# Patient Record
Sex: Female | Born: 1955 | Race: White | Hispanic: No | Marital: Married | Smoking: Former smoker
Health system: Southern US, Community
[De-identification: ages and names within clinical notes are randomized; demographics above are authoritative.]

## PROBLEM LIST (undated history)

## (undated) DIAGNOSIS — F329 Major depressive disorder, single episode, unspecified: Secondary | ICD-10-CM

## (undated) DIAGNOSIS — C50919 Malignant neoplasm of unspecified site of unspecified female breast: Secondary | ICD-10-CM

## (undated) DIAGNOSIS — M549 Dorsalgia, unspecified: Secondary | ICD-10-CM

## (undated) DIAGNOSIS — M25569 Pain in unspecified knee: Secondary | ICD-10-CM

## (undated) DIAGNOSIS — G8929 Other chronic pain: Secondary | ICD-10-CM

## (undated) DIAGNOSIS — M1990 Unspecified osteoarthritis, unspecified site: Secondary | ICD-10-CM

## (undated) DIAGNOSIS — F32A Depression, unspecified: Secondary | ICD-10-CM

## (undated) HISTORY — PX: WISDOM TOOTH EXTRACTION: SHX21

## (undated) HISTORY — PX: LUMBAR EPIDURAL INJECTION: SHX1980

## (undated) HISTORY — PX: BREAST LUMPECTOMY: SHX2

---

## 2002-09-11 ENCOUNTER — Encounter: Admission: RE | Admit: 2002-09-11 | Discharge: 2002-12-10 | Payer: Self-pay | Admitting: Family Medicine

## 2011-10-30 ENCOUNTER — Emergency Department (HOSPITAL_BASED_OUTPATIENT_CLINIC_OR_DEPARTMENT_OTHER)
Admission: EM | Admit: 2011-10-30 | Discharge: 2011-10-30 | Disposition: A | Discharge status: Home Health | Payer: BC Managed Care – PPO | Attending: Emergency Medicine | Admitting: Emergency Medicine

## 2011-10-30 ENCOUNTER — Encounter (HOSPITAL_BASED_OUTPATIENT_CLINIC_OR_DEPARTMENT_OTHER): Payer: Self-pay | Admitting: Emergency Medicine

## 2011-10-30 ENCOUNTER — Emergency Department (INDEPENDENT_AMBULATORY_CARE_PROVIDER_SITE_OTHER): Payer: BC Managed Care – PPO

## 2011-10-30 DIAGNOSIS — M1710 Unilateral primary osteoarthritis, unspecified knee: Secondary | ICD-10-CM

## 2011-10-30 DIAGNOSIS — F329 Major depressive disorder, single episode, unspecified: Secondary | ICD-10-CM | POA: Insufficient documentation

## 2011-10-30 DIAGNOSIS — M25569 Pain in unspecified knee: Secondary | ICD-10-CM

## 2011-10-30 DIAGNOSIS — F3289 Other specified depressive episodes: Secondary | ICD-10-CM | POA: Insufficient documentation

## 2011-10-30 DIAGNOSIS — Z87891 Personal history of nicotine dependence: Secondary | ICD-10-CM | POA: Insufficient documentation

## 2011-10-30 DIAGNOSIS — M129 Arthropathy, unspecified: Secondary | ICD-10-CM | POA: Insufficient documentation

## 2011-10-30 HISTORY — DX: Other chronic pain: G89.29

## 2011-10-30 HISTORY — DX: Depression, unspecified: F32.A

## 2011-10-30 HISTORY — DX: Major depressive disorder, single episode, unspecified: F32.9

## 2011-10-30 HISTORY — DX: Pain in unspecified knee: M25.569

## 2011-10-30 HISTORY — DX: Unspecified osteoarthritis, unspecified site: M19.90

## 2011-10-30 HISTORY — DX: Dorsalgia, unspecified: M54.9

## 2011-10-30 MED ORDER — HYDROCODONE-ACETAMINOPHEN 5-325 MG PO TABS: 1.0000 | ORAL_TABLET | ORAL | Status: AC | PRN

## 2011-10-30 NOTE — Discharge Instructions (Signed)
Degenerative Arthritis  You have osteoarthritis. This is the wear and tear arthritis that comes with aging. It is also called degenerative arthritis. This is common in people past middle age. It is caused by stress on the joints. The large weight bearing joints of the lower extremities are most often affected. The knees, hips, back, neck, and hands can become painful, swollen, and stiff. This is the most common type of arthritis. It comes on with age, carrying too much weight, or from an injury.  Treatment includes resting the sore joint until the pain and swelling improve. Crutches or a walker may be needed for severe flares. Only take over-the-counter or prescription medicines for pain, discomfort, or fever as directed by your caregiver. Local heat therapy may improve motion. Cortisone shots into the joint are sometimes used to reduce pain and swelling during flares.  Osteoarthritis is usually not crippling and progresses slowly. There are things you can do to decrease pain:  · Avoid high impact activities.  · Exercise regularly.  · Low impact exercises such as walking, biking and swimming help to keep the muscles strong and keep normal joint function.  · Stretching helps to keep your range of motion.  · Lose weight if you are overweight. This reduces joint stress.  In severe cases when you have pain at rest or increasing disability, joint surgery may be helpful. See your caregiver for follow-up treatment as recommended.   SEEK IMMEDIATE MEDICAL CARE IF:   · You have severe joint pain.  · Marked swelling and redness in your joint develops.  · You develop a high fever.  Document Released: 08/08/2005 Document Revised: 07/28/2011 Document Reviewed: 01/08/2007  ExitCare® Patient Information ©2012 [employer].

## 2011-10-30 NOTE — ED Notes (Signed)
Left knee pain worsening since last night.  Pt states she hears a clicking sound.  No injury or fever.  No warmth to area noted.  Good CMS.

## 2011-10-30 NOTE — ED Provider Notes (Signed)
History     CSN: [id number]  Arrival date & time 10/30/11  1242   First MD Initiated Contact with Patient 10/30/11 1422      Chief Complaint  Patient presents with  . Knee Pain    (Consider location/radiation/quality/duration/timing/severity/associated sxs/prior treatment) Patient is a 56 y.o. female presenting with knee pain. The history is provided by the patient.  Knee Pain This is a chronic problem. The problem occurs constantly. The problem has been gradually worsening. Pertinent negatives include no chest pain and no shortness of breath. The symptoms are aggravated by bending and walking. The symptoms are relieved by nothing.   Pt with chronic arthritis in L knee and p/w worsening of ongoing pain now with clicking sound when bending knee. No fever, chill, N/V, calf swelling or pain. No redness or warmth Past Medical History  Diagnosis Date  . Arthritis   . Depression   . Chronic back pain   . Chronic knee pain     Past Surgical History  Procedure Date  . Wisdom tooth extraction   . Lumbar epidural injection     History reviewed. No pertinent family history.  History  Substance Use Topics  . Smoking status: Former Games developer  . Smokeless tobacco: Not on file  . Alcohol Use: No    OB History    Grav Para Term Preterm Abortions TAB SAB Ect Mult Living                  Review of Systems  Constitutional: Negative for fever and chills.  Respiratory: Negative for shortness of breath.   Cardiovascular: Negative for chest pain, palpitations and leg swelling.  Musculoskeletal: Positive for arthralgias. Negative for myalgias and back pain.  Skin: Negative for color change, pallor, rash and wound.  Neurological: Negative for weakness and numbness.    Allergies  Ciprofloxacin  Home Medications   Current Outpatient Rx  Name Route Sig Dispense Refill  . ASPIRIN 81 MG PO CHEW Oral Chew 81 mg by mouth daily.    . BUPROPION HCL ER (XL) 300 MG PO TB24 Oral Take 300 mg  by mouth daily.    . CELECOXIB 200 MG PO CAPS Oral Take 200 mg by mouth daily.    Marland Kitchen CETIRIZINE-PSEUDOEPHEDRINE ER 5-120 MG PO TB12 Oral Take 1 tablet by mouth daily as needed.    . DULOXETINE HCL 60 MG PO CPEP Oral Take 60 mg by mouth daily.    Marland Kitchen ESTROGENS CONJUGATED 0.3 MG PO TABS Oral Take 0.3 mg by mouth daily. Take daily for 21 days then do not take for 7 days.    Marland Kitchen SIMVASTATIN 10 MG PO TABS Oral Take 10 mg by mouth at bedtime.    Marland Kitchen HYDROCODONE-ACETAMINOPHEN 5-325 MG PO TABS Oral Take 1 tablet by mouth every 4 (four) hours as needed for pain. 10 tablet 0    BP 111/67  Pulse 83  Temp(Src) 97.5 F (36.4 C) (Oral)  Resp 16  SpO2 97%  Physical Exam  Constitutional: She is oriented to person, place, and time. She appears well-developed and well-nourished.       obese  HENT:  Head: Normocephalic and atraumatic.  Neck: Normal range of motion. Neck supple.  Pulmonary/Chest: Effort normal.  Abdominal: Soft.  Musculoskeletal:       Crepitance with ROM of L knee. No laxity, redness, swelling, injury, warmth. 2+ radial pulses. Sensation and motor intact  Neurological: She is alert and oriented to person, place, and time.  Skin: Skin  is warm and dry. No rash noted. No erythema. No pallor.    ED Course  Procedures (including critical care time)  Labs Reviewed - No data to display Dg Knee Complete 4 Views Left  10/30/2011  *RADIOLOGY REPORT*  Clinical Data: 56 year old female with left knee pain.  LEFT KNEE - COMPLETE 4+ VIEW  Comparison: None  Findings: Joint space narrowing and osteophytosis and all three compartments noted. There is no evidence of fracture, subluxation or dislocation. No focal bony lesions are present. There is no evidence of joint effusion.  IMPRESSION: No evidence of acute abnormality.  Mild - moderate tricompartmental degenerative changes.  Original Report Authenticated By: Rosendo Gros, M.D.     1. Degenerative arthritis of knee       MDM    Counseled of  need to lose weight and F/U with ortho.   Loren Racer, MD 10/30/11 1447

## 2011-11-02 ENCOUNTER — Other Ambulatory Visit: Payer: Self-pay | Admitting: Internal Medicine

## 2014-01-22 ENCOUNTER — Emergency Department (HOSPITAL_BASED_OUTPATIENT_CLINIC_OR_DEPARTMENT_OTHER)
Admission: EM | Admit: 2014-01-22 | Discharge: 2014-01-22 | Disposition: A | Discharge status: Home / Self Care | Payer: BC Managed Care – PPO | Attending: Emergency Medicine | Admitting: Emergency Medicine

## 2014-01-22 ENCOUNTER — Encounter (HOSPITAL_BASED_OUTPATIENT_CLINIC_OR_DEPARTMENT_OTHER): Payer: Self-pay | Admitting: Emergency Medicine

## 2014-01-22 ENCOUNTER — Emergency Department (HOSPITAL_BASED_OUTPATIENT_CLINIC_OR_DEPARTMENT_OTHER): Payer: BC Managed Care – PPO

## 2014-01-22 DIAGNOSIS — F3289 Other specified depressive episodes: Secondary | ICD-10-CM | POA: Insufficient documentation

## 2014-01-22 DIAGNOSIS — M545 Low back pain, unspecified: Secondary | ICD-10-CM | POA: Insufficient documentation

## 2014-01-22 DIAGNOSIS — Z853 Personal history of malignant neoplasm of breast: Secondary | ICD-10-CM | POA: Insufficient documentation

## 2014-01-22 DIAGNOSIS — Z7982 Long term (current) use of aspirin: Secondary | ICD-10-CM | POA: Insufficient documentation

## 2014-01-22 DIAGNOSIS — M549 Dorsalgia, unspecified: Secondary | ICD-10-CM

## 2014-01-22 DIAGNOSIS — Z79899 Other long term (current) drug therapy: Secondary | ICD-10-CM | POA: Insufficient documentation

## 2014-01-22 DIAGNOSIS — Z791 Long term (current) use of non-steroidal anti-inflammatories (NSAID): Secondary | ICD-10-CM | POA: Insufficient documentation

## 2014-01-22 DIAGNOSIS — M129 Arthropathy, unspecified: Secondary | ICD-10-CM | POA: Insufficient documentation

## 2014-01-22 DIAGNOSIS — Z9889 Other specified postprocedural states: Secondary | ICD-10-CM | POA: Insufficient documentation

## 2014-01-22 DIAGNOSIS — G8929 Other chronic pain: Secondary | ICD-10-CM | POA: Insufficient documentation

## 2014-01-22 DIAGNOSIS — F329 Major depressive disorder, single episode, unspecified: Secondary | ICD-10-CM | POA: Insufficient documentation

## 2014-01-22 HISTORY — DX: Malignant neoplasm of unspecified site of unspecified female breast: C50.919

## 2014-01-22 LAB — SEDIMENTATION RATE: SED RATE: 13 mm/h (ref 0–22)

## 2014-01-22 MED ORDER — HYDROMORPHONE HCL PF 2 MG/ML IJ SOLN
2.0000 mg | Freq: Once | INTRAMUSCULAR | Status: AC
  Administered 2014-01-22: 2 mg via INTRAMUSCULAR
  Filled 2014-01-22: qty 1

## 2014-01-22 MED ORDER — DEXAMETHASONE SODIUM PHOSPHATE 10 MG/ML IJ SOLN
10.0000 mg | Freq: Once | INTRAMUSCULAR | Status: AC
  Administered 2014-01-22: 10 mg via INTRAMUSCULAR
  Filled 2014-01-22: qty 1

## 2014-01-22 NOTE — Discharge Instructions (Signed)
Back Pain, Adult Low back pain is very common. About 1 in 5 people have back pain.The cause of low back pain is rarely dangerous. The pain often gets better over time.About half of people with a sudden onset of back pain feel better in just 2 weeks. About 8 in 10 people feel better by 6 weeks.  CAUSES Some common causes of back pain include:  Strain of the muscles or ligaments supporting the spine.  Wear and tear (degeneration) of the spinal discs.  Arthritis.  Direct injury to the back. DIAGNOSIS Most of the time, the direct cause of low back pain is not known.However, back pain can be treated effectively even when the exact cause of the pain is unknown.Answering your caregiver's questions about your overall health and symptoms is one of the most accurate ways to make sure the cause of your pain is not dangerous. If your caregiver needs more information, he or she may order lab work or imaging tests (X-rays or MRIs).However, even if imaging tests show changes in your back, this usually does not require surgery. HOME CARE INSTRUCTIONS For many people, back pain returns.Since low back pain is rarely dangerous, it is often a condition that people can learn to manageon their own.   Remain active. It is stressful on the back to sit or stand in one place. Do not sit, drive, or stand in one place for more than 30 minutes at a time. Take short walks on level surfaces as soon as pain allows.Try to increase the length of time you walk each day.  Do not stay in bed.Resting more than 1 or 2 days can delay your recovery.  Do not avoid exercise or work.Your body is made to move.It is not dangerous to be active, even though your back may hurt.Your back will likely heal faster if you return to being active before your pain is gone.  Pay attention to your body when you bend and lift. Many people have less discomfortwhen lifting if they bend their knees, keep the load close to their bodies,and  avoid twisting. Often, the most comfortable positions are those that put less stress on your recovering back.  Find a comfortable position to sleep. Use a firm mattress and lie on your side with your knees slightly bent. If you lie on your back, put a pillow under your knees.  Only take over-the-counter or prescription medicines as directed by your caregiver. Over-the-counter medicines to reduce pain and inflammation are often the most helpful.Your caregiver may prescribe muscle relaxant drugs.These medicines help dull your pain so you can more quickly return to your normal activities and healthy exercise.  Put ice on the injured area.  Put ice in a plastic bag.  Place a towel between your skin and the bag.  Leave the ice on for 15-20 minutes, 03-04 times a day for the first 2 to 3 days. After that, ice and heat may be alternated to reduce pain and spasms.  Ask your caregiver about trying back exercises and gentle massage. This may be of some benefit.  Avoid feeling anxious or stressed.Stress increases muscle tension and can worsen back pain.It is important to recognize when you are anxious or stressed and learn ways to manage it.Exercise is a great option. SEEK MEDICAL CARE IF:  You have pain that is not relieved with rest or medicine.  You have pain that does not improve in 1 week.  You have new symptoms.  You are generally not feeling well. SEEK   IMMEDIATE MEDICAL CARE IF:   You have pain that radiates from your back into your legs.  You develop new bowel or bladder control problems.  You have unusual weakness or numbness in your arms or legs.  You develop nausea or vomiting.  You develop abdominal pain.  You feel faint. Document Released: 08/08/2005 Document Revised: 02/07/2012 Document Reviewed: 12/27/2010 ExitCare Patient Information 2014 [employer].  

## 2014-01-22 NOTE — ED Notes (Signed)
Pt c/o right hip pain x 5 days, no injury

## 2014-01-22 NOTE — ED Provider Notes (Signed)
CSN: [id number]     Arrival date & time 01/22/14  1930 History  This chart was scribed for Shannon Johns, MD, by Neta Ehlers, ED Scribe. This patient was seen in room MH06/MH06 and the patient's care was started at 8:41 PM.    First MD Initiated Contact with Patient 01/22/14 2028     Chief Complaint  Patient presents with  . Hip Pain    The history is provided by the patient and the spouse. No language interpreter was used.   HPI Comments: Shannon Lutz is a 58 y.o. female who presents to the Emergency Department complaining of right-sided hip and buttocks pain which has waxed and wanted for five days, but which worsened this morning. Ms. Shannon Lutz also reports areas of numbness to her upper buttocks. She denies abdominal pain or fevers. She states a h/o similar pain to the same location, but she reports the previous episodes have not been as severe as the current; she reports a h/o sciatica and bursitis. She has used two 7.5 mg hydrocodone and celebrex for the pain today without resolution. Ms. Shannon Lutz has an appointment with her neurologist tomorrow.The pt fell approximately 4 weeks ago, but she is uncertain she injured her hip in the fall.  Ms. Shannon Lutz has a h/o breast cancer; she finished treatment several weeks ago. She denies DM.   Past Medical History  Diagnosis Date  . Arthritis   . Depression   . Chronic back pain   . Chronic knee pain   . Breast CA    Past Surgical History  Procedure Laterality Date  . Wisdom tooth extraction    . Lumbar epidural injection    . Breast lumpectomy     History reviewed. No pertinent family history. History  Substance Use Topics  . Smoking status: Former Research scientist (life sciences)  . Smokeless tobacco: Not on file  . Alcohol Use: No   No OB history provided.  Review of Systems  Constitutional: Negative for fever, chills, diaphoresis and fatigue.  HENT: Negative for congestion, rhinorrhea and sneezing.   Eyes: Negative.   Respiratory: Negative for cough, chest  tightness and shortness of breath.   Cardiovascular: Negative for chest pain and leg swelling.  Gastrointestinal: Negative for nausea, vomiting, abdominal pain, diarrhea and blood in stool.  Genitourinary: Negative for frequency, hematuria, flank pain and difficulty urinating.  Musculoskeletal: Positive for back pain. Negative for arthralgias.  Skin: Negative for rash.  Neurological: Negative for dizziness, speech difficulty, weakness, numbness and headaches.      Allergies  Ciprofloxacin  Home Medications   Prior to Admission medications   Medication Sig Start Date End Date Taking? Authorizing Provider  atomoxetine (STRATTERA) 100 MG capsule Take 100 mg by mouth daily.   Yes Historical Provider, MD  folic acid (FOLVITE) 1 MG tablet Take 1 mg by mouth daily.   Yes Historical Provider, MD  HYDROcodone-ibuprofen (VICOPROFEN) 7.5-200 MG per tablet Take 2 tablets by mouth every 8 (eight) hours as needed for moderate pain.   Yes Historical Provider, MD  tamoxifen (NOLVADEX) 20 MG tablet Take 20 mg by mouth daily.   Yes Historical Provider, MD  aspirin 81 MG chewable tablet Chew 81 mg by mouth daily.    Historical Provider, MD  buPROPion (WELLBUTRIN XL) 300 MG 24 hr tablet Take 300 mg by mouth daily.    Historical Provider, MD  celecoxib (CELEBREX) 200 MG capsule Take 200 mg by mouth daily.    Historical Provider, MD  cetirizine-pseudoephedrine (ZYRTEC-D) 5-120 MG per tablet  Take 1 tablet by mouth daily as needed.    Historical Provider, MD  DULoxetine (CYMBALTA) 60 MG capsule Take 60 mg by mouth daily.    Historical Provider, MD  estrogens, conjugated, (PREMARIN) 0.3 MG tablet Take 0.3 mg by mouth daily. Take daily for 21 days then do not take for 7 days.    Historical Provider, MD  simvastatin (ZOCOR) 10 MG tablet Take 10 mg by mouth at bedtime.    Historical Provider, MD   Triage Bitals: BP 140/75  Pulse 78  Temp(Src) 98 F (36.7 C) (Oral)  Resp 16  Ht 5\' 1"  (1.549 m)  Wt 216 lb  (97.977 kg)  BMI 40.83 kg/m2  Physical Exam  Constitutional: She is oriented to person, place, and time. She appears well-developed and well-nourished.  HENT:  Head: Normocephalic and atraumatic.  Eyes: Pupils are equal, round, and reactive to light.  Neck: Normal range of motion. Neck supple.  Cardiovascular: Normal rate, regular rhythm and normal heart sounds.   Pulmonary/Chest: Effort normal and breath sounds normal. No respiratory distress. She has no wheezes. She has no rales. She exhibits no tenderness.  Abdominal: Soft. Bowel sounds are normal. There is no tenderness. There is no rebound and no guarding.  Musculoskeletal: Normal range of motion. She exhibits no edema.  Positive tenderness to the right lower lumbar paraspinal area and along the right sciatic nerve. Negative straight leg raise bilaterally. Patellar reflexes symmetric bilaterally. She has normal sensation and motor function in the legs. Pedal pulses are symmetric.  Lymphadenopathy:    She has no cervical adenopathy.  Neurological: She is alert and oriented to person, place, and time.  Skin: Skin is warm and dry. No rash noted.  Psychiatric: She has a normal mood and affect.    ED Course  Procedures (including critical care time)  COORDINATION OF CARE:  8:49 PM- Discussed treatment plan with patient, and the patient agreed to the plan. The plan includes pain medication and imaging.   Labs Review Labs Reviewed  SEDIMENTATION RATE    Imaging Review Dg Lumbar Spine Complete  01/22/2014   CLINICAL DATA:  Hip pain.  Lower back pain.  EXAM: LUMBAR SPINE - COMPLETE 4+ VIEW  COMPARISON:  10/10/2010  FINDINGS: Significant progression of disc narrowing at L3-4, with new sclerosis around the disc level. There is subchondral lucency on both sides, suggesting erosions. Question vacuum phenomenon at this level, which would be a reassuring finding.  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 There is also chronic and stable degenerative disc narrowing and endplate spurring at P7-1.  No acute fracture.  Extensive abdominal aortic atherosclerosis.  These results were called by telephone at the time of interpretation on 01/22/2014 at 9:21 PM to Dr. Threasa Beards Payton Prinsen , who verbally acknowledged these results.  IMPRESSION: 1. New, since 08/10/2011, disc disease at L3-4 which could represent discitis or rapid progression of degenerative disc disease. 2. Degenerative disc and facet disease in the remainder of the spine is stable from 2012.   Electronically Signed   By: Jorje Guild M.D.   On: 01/22/2014 21:30     EKG Interpretation None      MDM   Final diagnoses:  Back pain    Patient presents with right lower back pain and sciatica. She has no neurologic deficits. There is a question of discitis on x-ray. However she has a normal sedimentation rate making  this much less likely to be the case. She has an appointment to followup with her neurologist in the morning. I advised her to discuss the findings with the neurologist and they can decide whether she needs an MRI at that point. She has pain medications to use at home. She was given a dose of Dilaudid and Decadron in ED.  I personally performed the services described in this documentation, which was scribed in my presence.  The recorded information has been reviewed and considered.    Shannon Johns, MD 01/22/14 [phone]

## 2014-01-22 NOTE — ED Notes (Signed)
MD at bedside. 

## 2015-05-16 IMAGING — CR DG LUMBAR SPINE COMPLETE 4+V
5 series · 5 of 5 positions shown · non-contrast
Comparison: 10/10/2010

CLINICAL DATA: Hip pain.  Lower back pain.

EXAM:
LUMBAR SPINE - COMPLETE 4+ VIEW

[t l-spine a.p.]
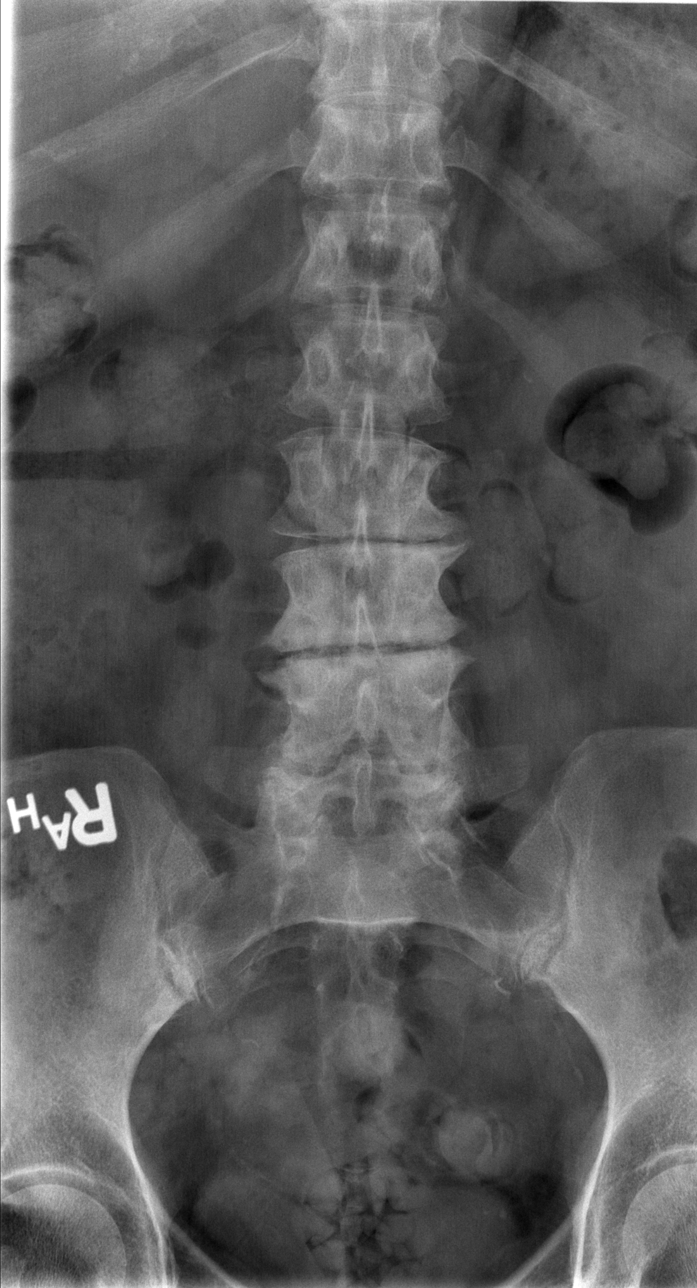

[t l-spine oblique exposure (1 of 2)]
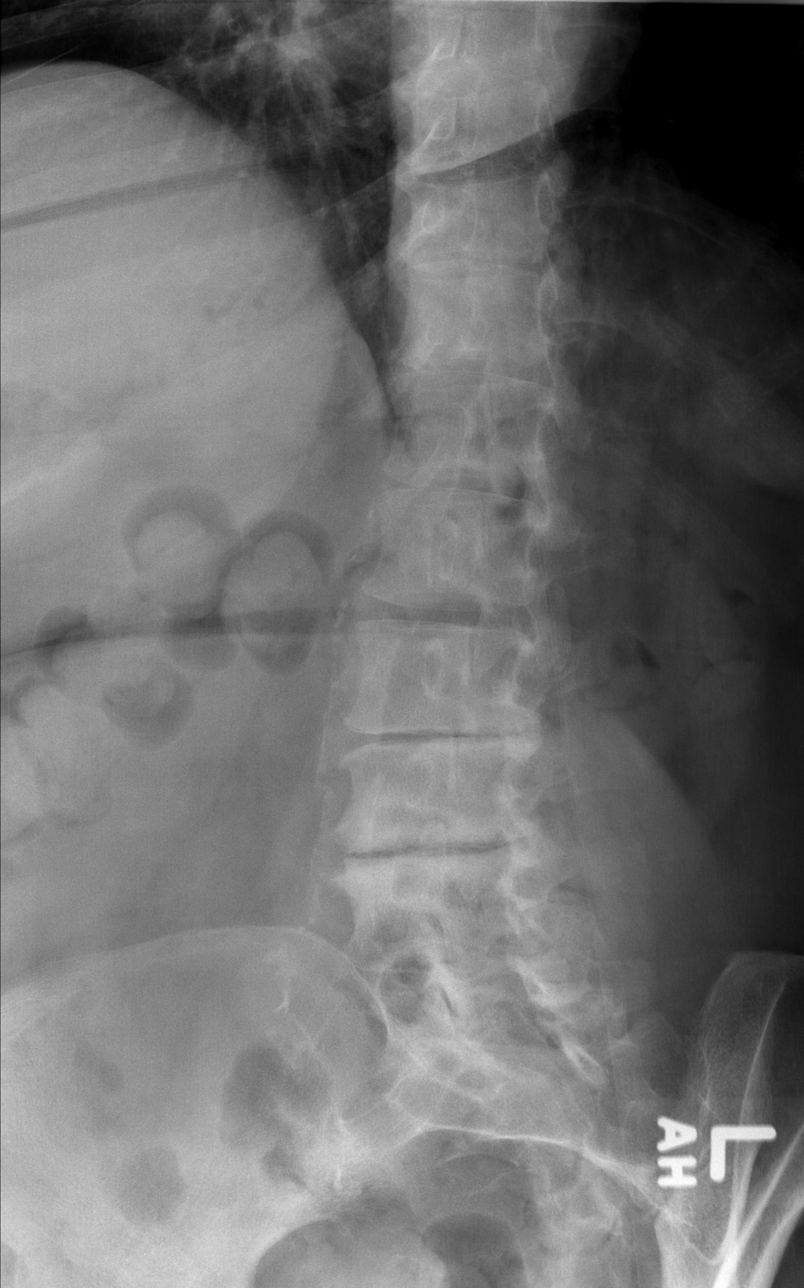

[t l-spine oblique exposure (2 of 2)]
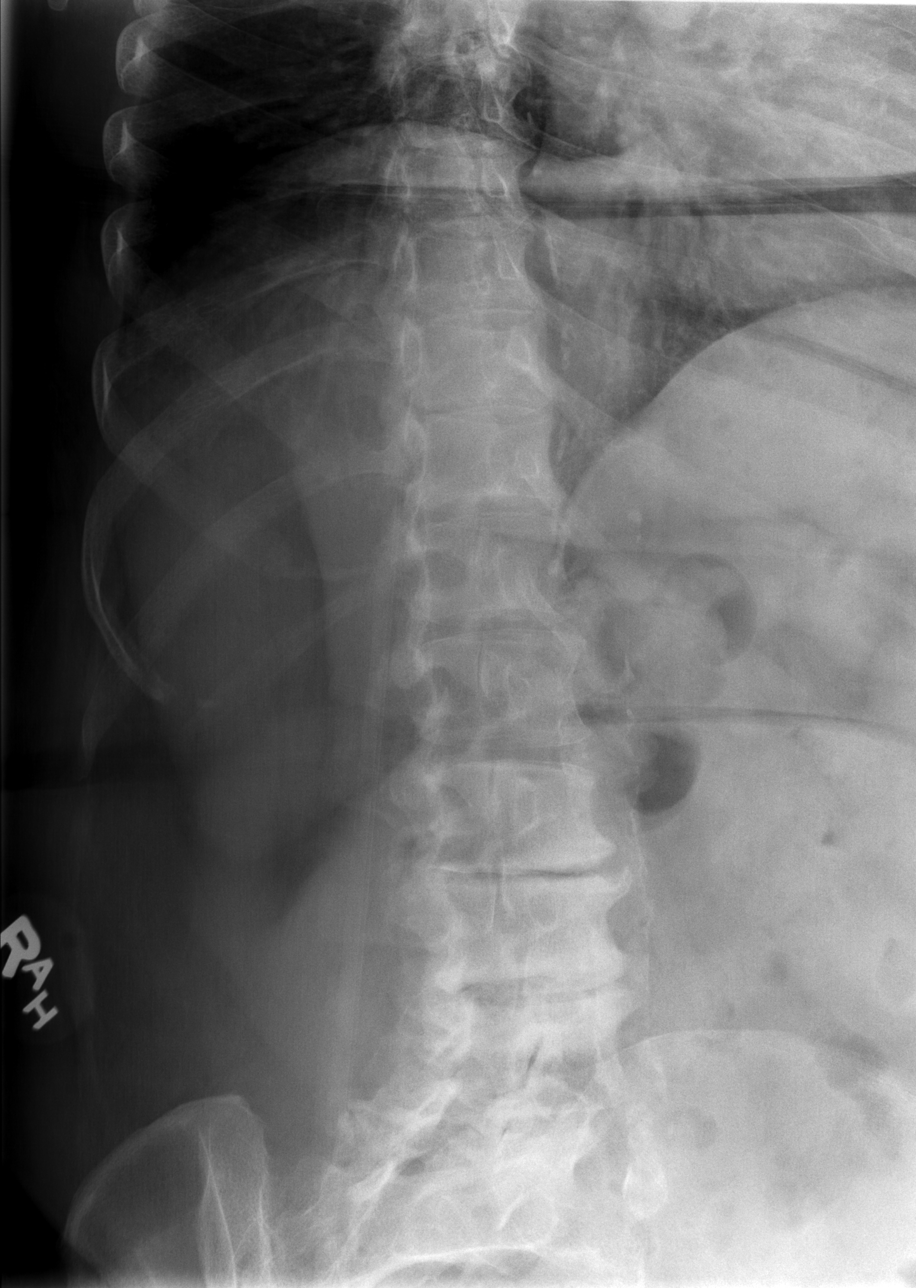

[t l-spine lat]
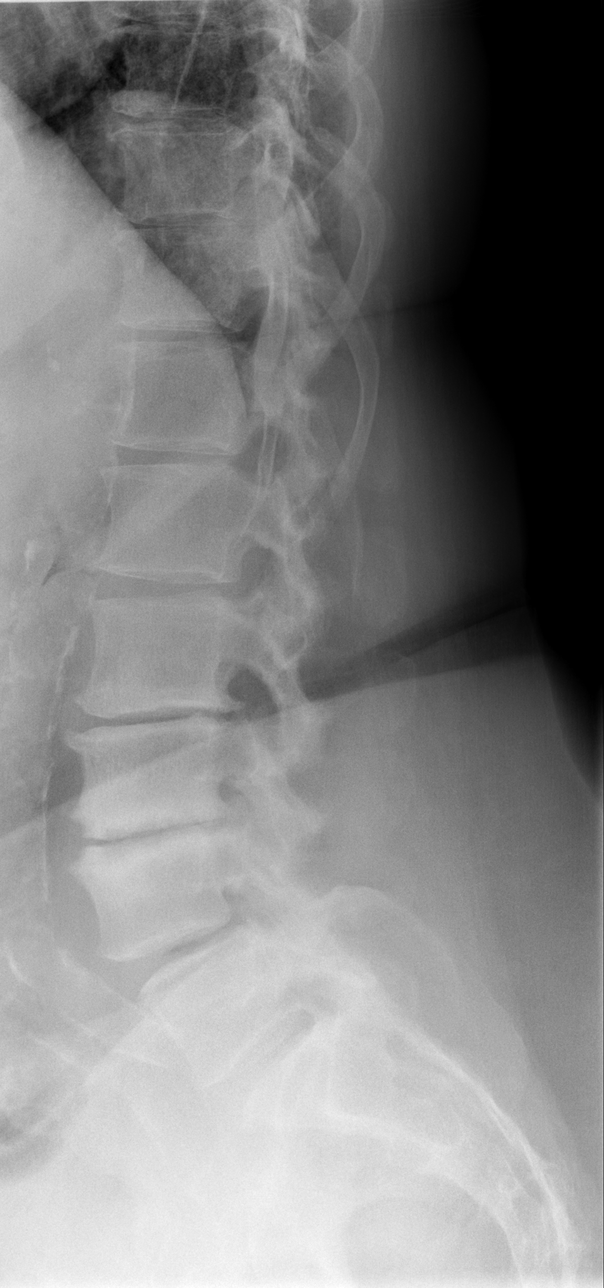

[t l-spine l5-s1 spot]
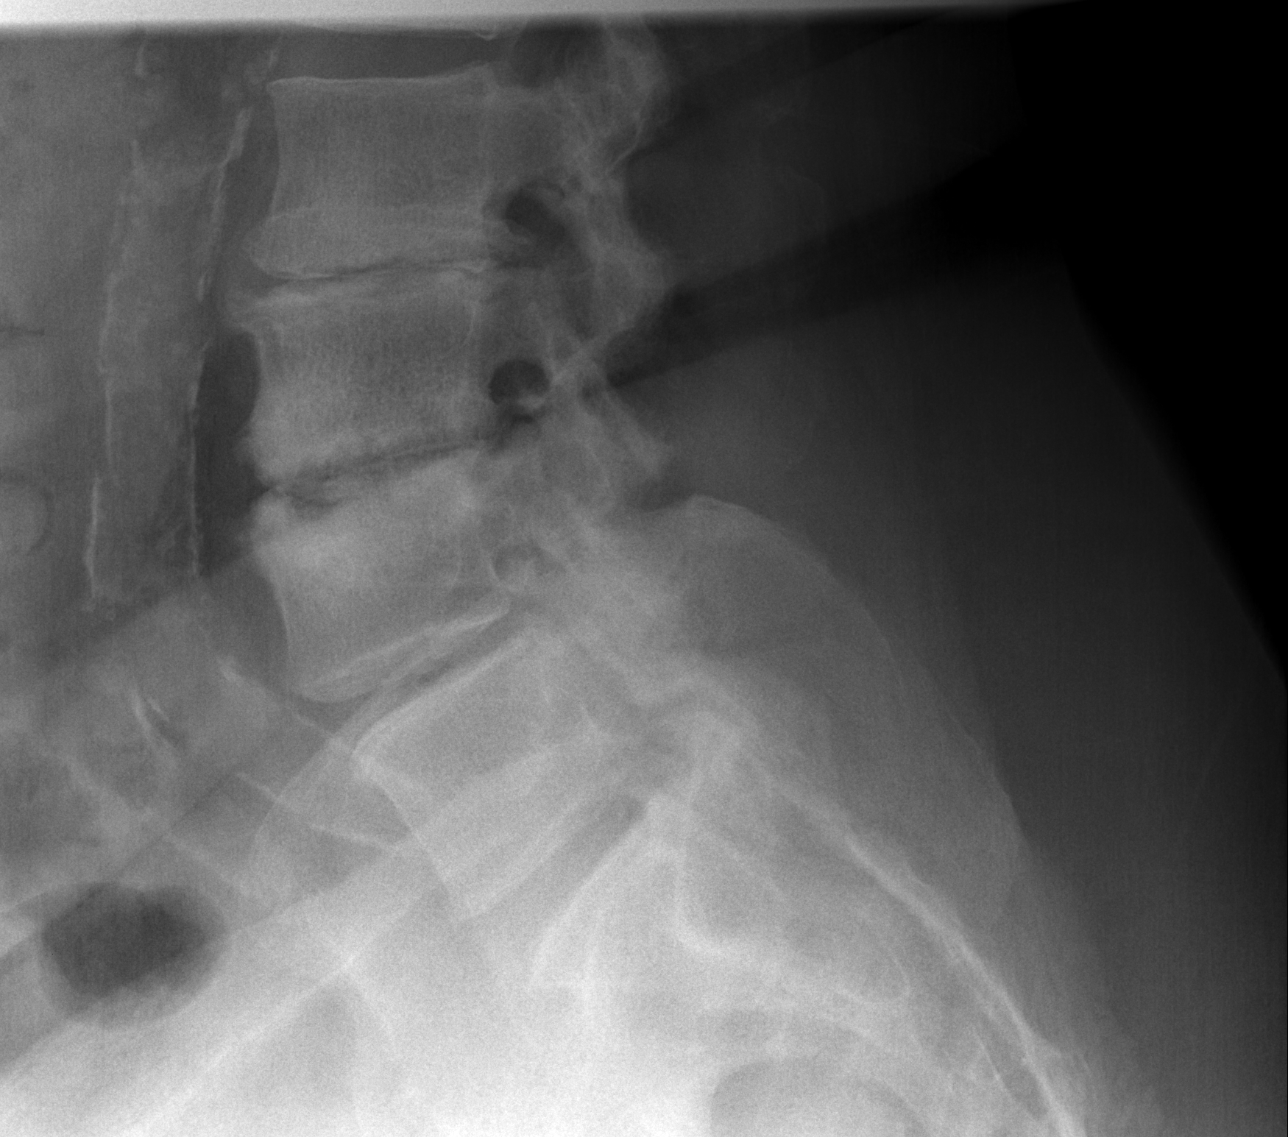

[5 of 5 positions shown; findings below may reference images not displayed]

FINDINGS: Significant progression of disc narrowing at L3-4, with new
sclerosis around the disc level. There is subchondral lucency on
both sides, suggesting erosions. Question vacuum phenomenon at this
level, which would be a reassuring finding.

Other degenerative changes are relatively stable from previous,
including advanced facet osteoarthritis in the lower lumbar spine,
with grade 1 L4-5 anterolisthesis. There is also chronic and stable
degenerative disc narrowing and endplate spurring at L2-3.

No acute fracture.

Extensive abdominal aortic atherosclerosis.

These results were called by telephone at the time of interpretation
on 01/22/2014 at [DATE] to Dr. MAN KUEN MABUTI , who verbally
acknowledged these results.
IMPRESSION: 1. New, since 08/10/2011, disc disease at L3-4 which could represent
discitis or rapid progression of degenerative disc disease.
2. Degenerative disc and facet disease in the remainder of the spine
is stable from 3403.
